# Patient Record
Sex: Female | Born: 1965 | Race: White | Hispanic: Yes | Marital: Single | Smoking: Never smoker
Health system: Southern US, Community
[De-identification: ages and names within clinical notes are randomized; demographics above are authoritative.]

---

## 2008-03-29 ENCOUNTER — Emergency Department (HOSPITAL_COMMUNITY): Admission: EM | Admit: 2008-03-29 | Discharge: 2008-03-29 | Payer: Self-pay | Admitting: Emergency Medicine

## 2015-02-04 ENCOUNTER — Emergency Department: Payer: No Typology Code available for payment source

## 2015-02-04 ENCOUNTER — Emergency Department
Admission: EM | Admit: 2015-02-04 | Discharge: 2015-02-04 | Disposition: A | Discharge status: Home / Self Care | Payer: No Typology Code available for payment source | Attending: Emergency Medicine | Admitting: Emergency Medicine

## 2015-02-04 ENCOUNTER — Encounter: Payer: Self-pay | Admitting: Emergency Medicine

## 2015-02-04 DIAGNOSIS — Y9389 Activity, other specified: Secondary | ICD-10-CM | POA: Diagnosis not present

## 2015-02-04 DIAGNOSIS — S199XXA Unspecified injury of neck, initial encounter: Secondary | ICD-10-CM | POA: Diagnosis present

## 2015-02-04 DIAGNOSIS — Y998 Other external cause status: Secondary | ICD-10-CM | POA: Diagnosis not present

## 2015-02-04 DIAGNOSIS — Y92410 Unspecified street and highway as the place of occurrence of the external cause: Secondary | ICD-10-CM | POA: Insufficient documentation

## 2015-02-04 DIAGNOSIS — S3991XA Unspecified injury of abdomen, initial encounter: Secondary | ICD-10-CM | POA: Insufficient documentation

## 2015-02-04 DIAGNOSIS — S0003XA Contusion of scalp, initial encounter: Secondary | ICD-10-CM | POA: Insufficient documentation

## 2015-02-04 DIAGNOSIS — S161XXA Strain of muscle, fascia and tendon at neck level, initial encounter: Secondary | ICD-10-CM | POA: Insufficient documentation

## 2015-02-04 LAB — URINALYSIS COMPLETE WITH MICROSCOPIC (ARMC ONLY)
BILIRUBIN URINE: NEGATIVE
Bacteria, UA: NONE SEEN
GLUCOSE, UA: NEGATIVE mg/dL
Hgb urine dipstick: NEGATIVE
KETONES UR: NEGATIVE mg/dL
LEUKOCYTES UA: NEGATIVE
Nitrite: NEGATIVE
PH: 5 (ref 5.0–8.0)
Protein, ur: NEGATIVE mg/dL
Specific Gravity, Urine: 1.016 (ref 1.005–1.030)

## 2015-02-04 MED ORDER — ORPHENADRINE CITRATE 30 MG/ML IJ SOLN
INTRAMUSCULAR | Status: AC
  Filled 2015-02-04: qty 2

## 2015-02-04 MED ORDER — ORPHENADRINE CITRATE 30 MG/ML IJ SOLN
60.0000 mg | Freq: Two times a day (BID) | INTRAMUSCULAR | Status: DC
  Administered 2015-02-04: 60 mg via INTRAMUSCULAR

## 2015-02-04 MED ORDER — METHOCARBAMOL 750 MG PO TABS: 1500.0000 mg | ORAL_TABLET | Freq: Four times a day (QID) | ORAL | Status: DC

## 2015-02-04 MED ORDER — TRAMADOL HCL 50 MG PO TABS: 50.0000 mg | ORAL_TABLET | Freq: Four times a day (QID) | ORAL | Status: DC | PRN

## 2015-02-04 MED ORDER — IBUPROFEN 800 MG PO TABS: 800.0000 mg | ORAL_TABLET | Freq: Three times a day (TID) | ORAL | Status: DC | PRN

## 2015-02-04 MED ORDER — KETOROLAC TROMETHAMINE 60 MG/2ML IM SOLN
60.0000 mg | Freq: Once | INTRAMUSCULAR | Status: AC
  Administered 2015-02-04: 60 mg via INTRAMUSCULAR

## 2015-02-04 MED ORDER — KETOROLAC TROMETHAMINE 60 MG/2ML IM SOLN
INTRAMUSCULAR | Status: AC
  Filled 2015-02-04: qty 2
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
## 2017-05-17 DIAGNOSIS — H524 Presbyopia: Secondary | ICD-10-CM | POA: Diagnosis not present

## 2017-05-17 DIAGNOSIS — H52221 Regular astigmatism, right eye: Secondary | ICD-10-CM | POA: Diagnosis not present

## 2017-05-17 DIAGNOSIS — H5201 Hypermetropia, right eye: Secondary | ICD-10-CM | POA: Diagnosis not present

## 2018-03-25 DIAGNOSIS — Z008 Encounter for other general examination: Secondary | ICD-10-CM | POA: Diagnosis not present

## 2018-03-25 DIAGNOSIS — R7309 Other abnormal glucose: Secondary | ICD-10-CM | POA: Diagnosis not present

## 2018-03-25 DIAGNOSIS — Z124 Encounter for screening for malignant neoplasm of cervix: Secondary | ICD-10-CM | POA: Diagnosis not present

## 2018-03-25 DIAGNOSIS — Z1211 Encounter for screening for malignant neoplasm of colon: Secondary | ICD-10-CM | POA: Diagnosis not present

## 2018-03-28 DIAGNOSIS — Z1211 Encounter for screening for malignant neoplasm of colon: Secondary | ICD-10-CM | POA: Diagnosis not present

## 2019-08-11 ENCOUNTER — Emergency Department: Payer: BC Managed Care – PPO

## 2019-08-11 ENCOUNTER — Other Ambulatory Visit: Payer: Self-pay

## 2019-08-11 ENCOUNTER — Emergency Department
Admission: EM | Admit: 2019-08-11 | Discharge: 2019-08-11 | Disposition: A | Discharge status: Home / Self Care | Payer: BC Managed Care – PPO | Attending: Emergency Medicine | Admitting: Emergency Medicine

## 2019-08-11 DIAGNOSIS — S0083XA Contusion of other part of head, initial encounter: Secondary | ICD-10-CM | POA: Diagnosis not present

## 2019-08-11 DIAGNOSIS — S0990XA Unspecified injury of head, initial encounter: Secondary | ICD-10-CM | POA: Diagnosis present

## 2019-08-11 DIAGNOSIS — Y939 Activity, unspecified: Secondary | ICD-10-CM | POA: Diagnosis not present

## 2019-08-11 DIAGNOSIS — Y999 Unspecified external cause status: Secondary | ICD-10-CM | POA: Insufficient documentation

## 2019-08-11 DIAGNOSIS — S39012A Strain of muscle, fascia and tendon of lower back, initial encounter: Secondary | ICD-10-CM | POA: Diagnosis not present

## 2019-08-11 DIAGNOSIS — Y92410 Unspecified street and highway as the place of occurrence of the external cause: Secondary | ICD-10-CM | POA: Insufficient documentation

## 2019-08-11 MED ORDER — METHOCARBAMOL 500 MG PO TABS: 500.0000 mg | ORAL_TABLET | Freq: Four times a day (QID) | ORAL | 0 refills | Status: AC

## 2019-08-11 MED ORDER — MELOXICAM 15 MG PO TABS: 15.0000 mg | ORAL_TABLET | Freq: Every day | ORAL | 0 refills | Status: AC
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
